# Patient Record
Sex: Female | Born: 1945 | Race: White | Hispanic: No | Smoking: Never smoker
Health system: Southern US, Community
[De-identification: ages and names within clinical notes are randomized; demographics above are authoritative.]

## PROBLEM LIST (undated history)

## (undated) DIAGNOSIS — R519 Headache, unspecified: Secondary | ICD-10-CM

## (undated) DIAGNOSIS — G2581 Restless legs syndrome: Secondary | ICD-10-CM

## (undated) DIAGNOSIS — R42 Dizziness and giddiness: Secondary | ICD-10-CM

## (undated) DIAGNOSIS — R200 Anesthesia of skin: Secondary | ICD-10-CM

## (undated) HISTORY — PX: BACK SURGERY: SHX140

## (undated) HISTORY — DX: Restless legs syndrome: G25.81

## (undated) HISTORY — DX: Dizziness and giddiness: R42

## (undated) HISTORY — DX: Anesthesia of skin: R20.0

## (undated) HISTORY — DX: Headache, unspecified: R51.9

---

## 2021-04-10 ENCOUNTER — Other Ambulatory Visit: Payer: Self-pay | Admitting: Neurological Surgery

## 2021-04-10 DIAGNOSIS — M4696 Unspecified inflammatory spondylopathy, lumbar region: Secondary | ICD-10-CM

## 2021-04-10 DIAGNOSIS — G959 Disease of spinal cord, unspecified: Secondary | ICD-10-CM

## 2021-04-25 ENCOUNTER — Ambulatory Visit
Admission: RE | Admit: 2021-04-25 | Discharge: 2021-04-25 | Disposition: A | Discharge status: Home / Self Care | Payer: Medicare Other | Source: Ambulatory Visit | Attending: Neurological Surgery | Admitting: Neurological Surgery

## 2021-04-25 DIAGNOSIS — G959 Disease of spinal cord, unspecified: Secondary | ICD-10-CM

## 2021-04-25 DIAGNOSIS — M4696 Unspecified inflammatory spondylopathy, lumbar region: Secondary | ICD-10-CM

## 2021-04-25 IMAGING — MR MR LUMBAR SPINE WO/W CM
4 of 7 series · 23 of 48 positions shown · IV contrast (multihance)
Comparison: Lumbar spine radiographs [DATE].

CLINICAL DATA: Discectomy. Low back pain extending into the lower
extremities bilaterally.

EXAM:
MRI LUMBAR SPINE WITHOUT AND WITH CONTRAST
TECHNIQUE: Multiplanar and multiecho pulse sequences of the lumbar spine were
obtained without and with intravenous contrast.
CONTRAST:  12mL MULTIHANCE GADOBENATE DIMEGLUMINE 529 MG/ML IV SOLN

[Series 3: T1 · sagittal · 4.0mm · 0.88mm/px · 3 of 15 slices shown (1 of 2)]
[im 1/15]
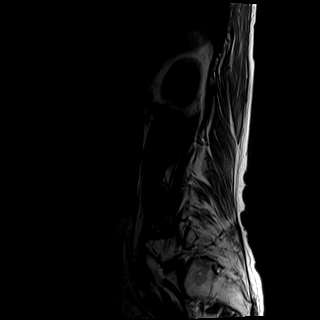
[im 8/15]
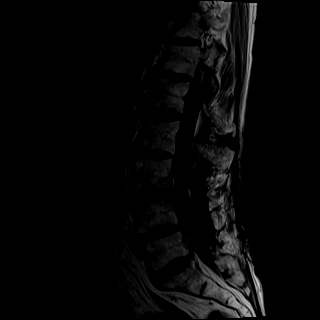
[im 15/15]
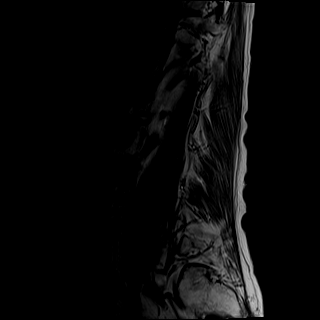

[Series 5: T2 · axial · 4.0mm · 0.39mm/px · z∈[-114,+111]mm · 11 of 44 slices shown (1 of 2)]
[im 1/44]
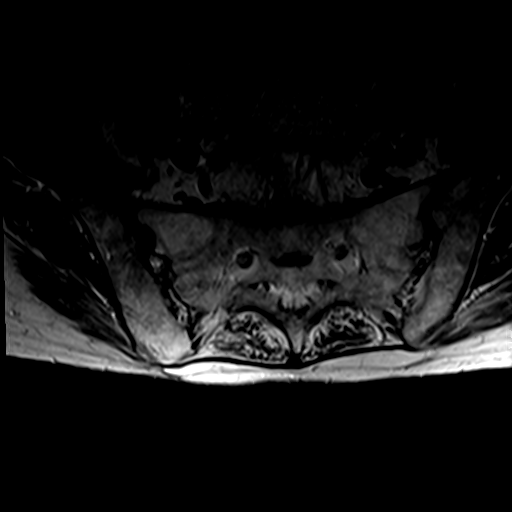
[im 5/44]
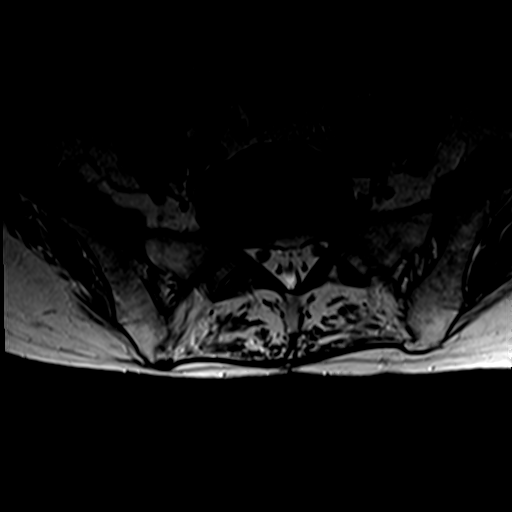
[im 9/44]
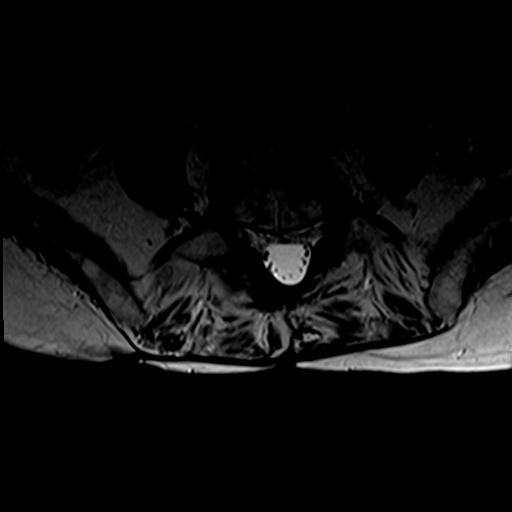
[im 13/44]
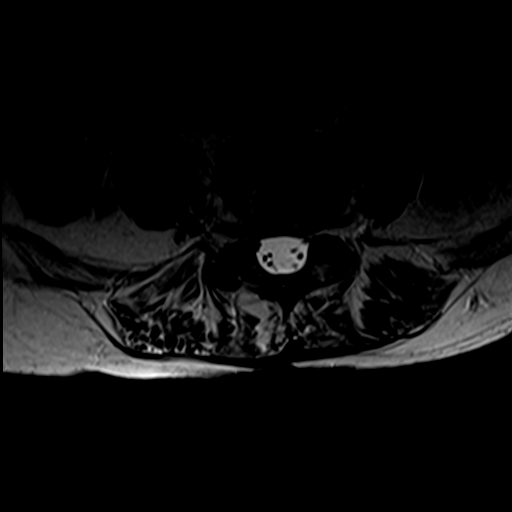
[im 18/44]
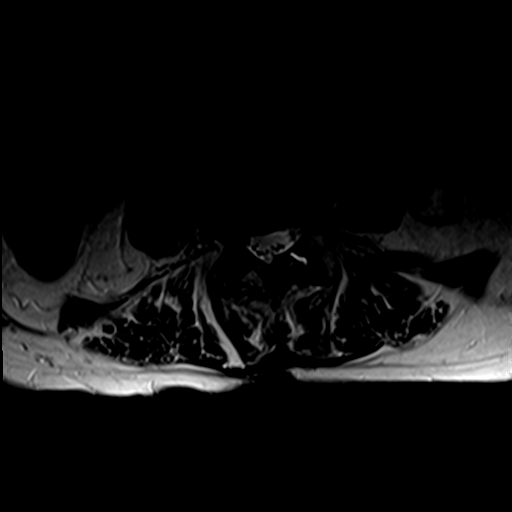
[im 22/44]
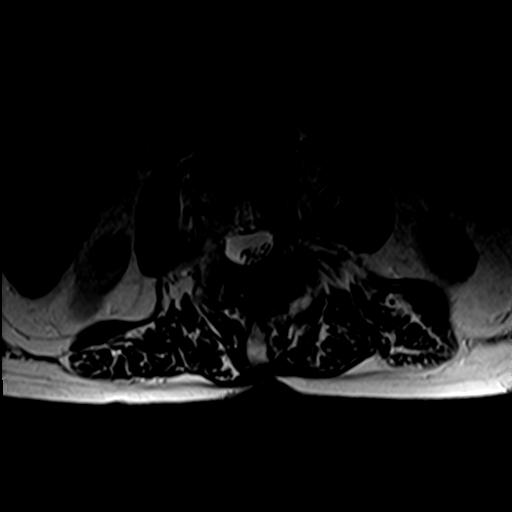
[im 26/44]
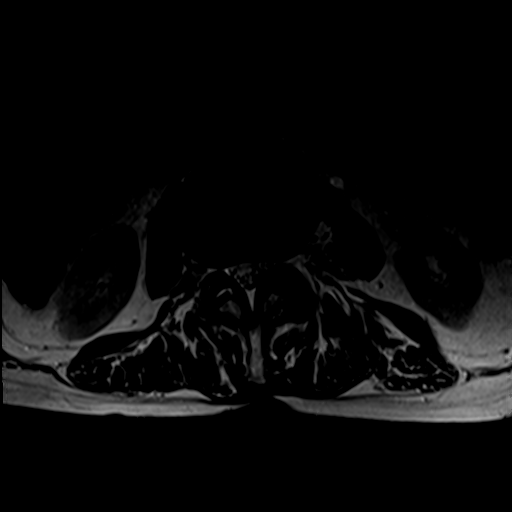
[im 31/44]
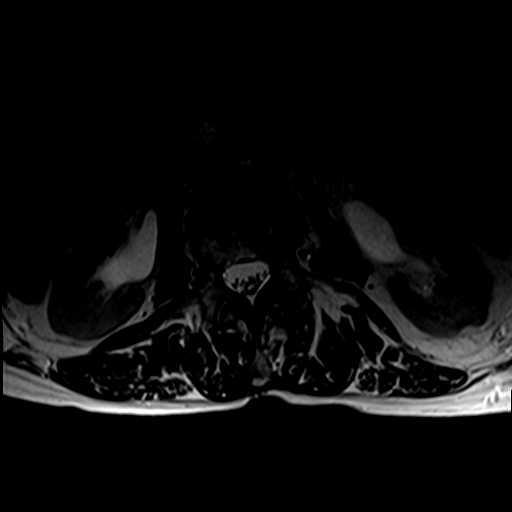
[im 35/44]
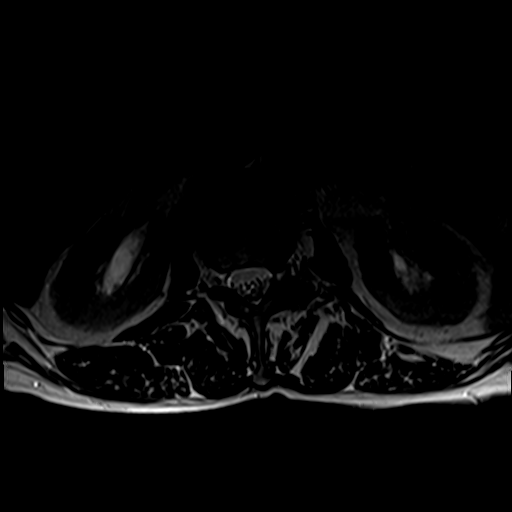
[im 39/44]
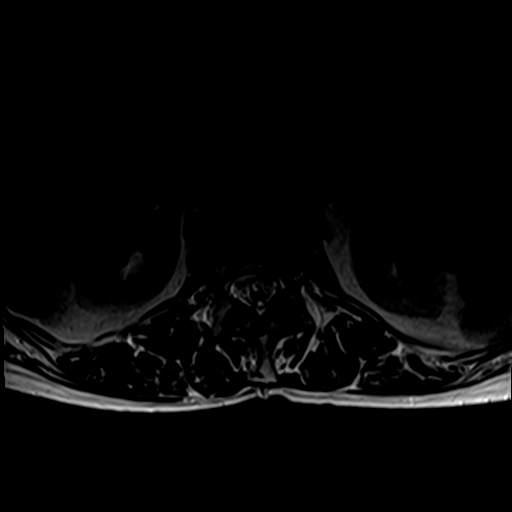
[im 44/44]
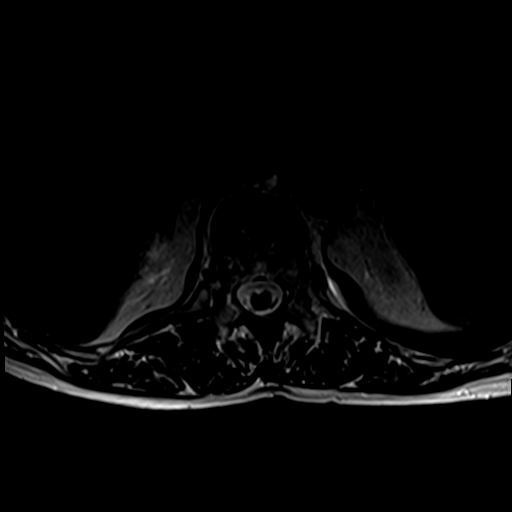

[Series 6: T1 · axial · 4.0mm · 0.39mm/px · z∈[-114,+87]mm · 5 of 44 slices shown (2 of 2)]
[im 1/44]
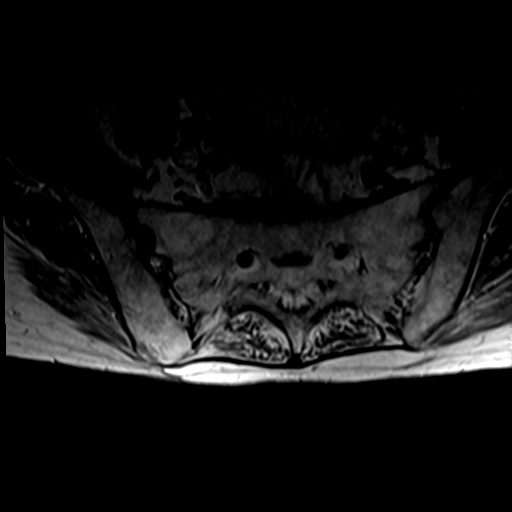
[im 5/44]
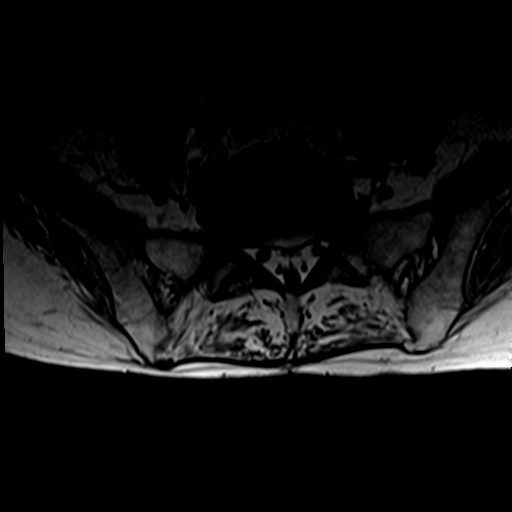
[im 9/44]
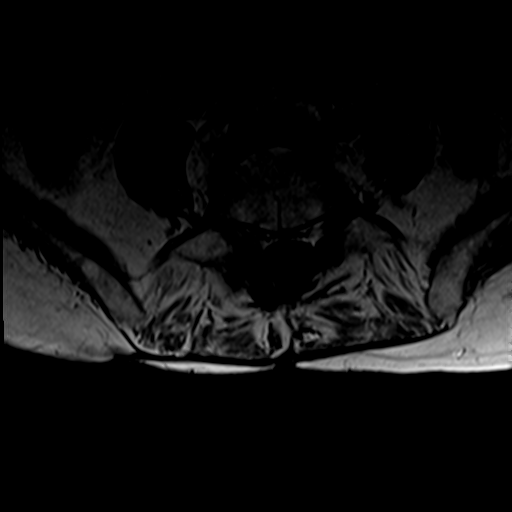
[im 22/44]
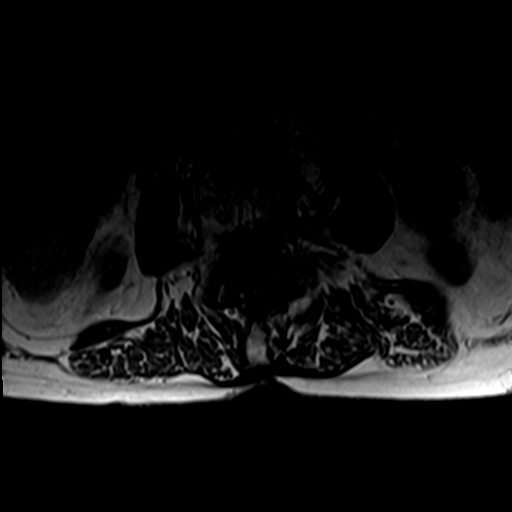
[im 39/44]
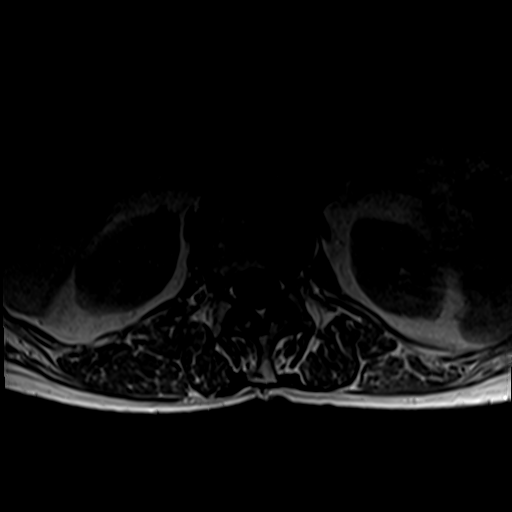

[Series 7: T2 · sagittal · 4.0mm · 1.09mm/px · 4 of 15 slices shown (2 of 2)]
[im 1/15]
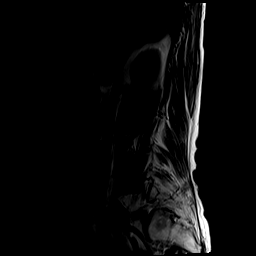
[im 5/15]
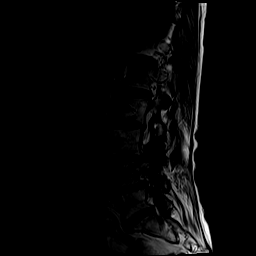
[im 10/15]
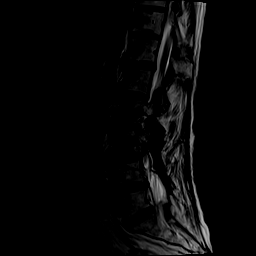
[im 15/15]
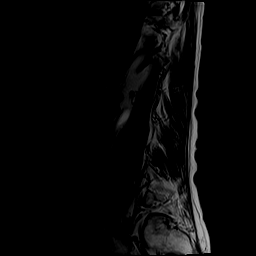

[23 of 48 positions shown; findings below may reference images not displayed]

FINDINGS: Segmentation: 5 non rib-bearing lumbar type vertebral bodies are
present. The lowest fully formed vertebral body is L5.

Alignment: Slight degenerative anterolisthesis is present at L3-4.
No other significant listhesis is present. Rightward curvature is
centered at L2-3.

Vertebrae: Edematous changes are present posteriorly at L3-4. Marrow
signal and vertebral body heights are otherwise normal.

Conus medullaris and cauda equina: Conus extends to the L1 level.
Conus and cauda equina appear normal.

Paraspinal and other soft tissues: Limited imaging the abdomen is
unremarkable. There is no significant adenopathy. No solid organ
lesions are present.

Disc levels:

T12-L1: Mild disc bulging and facet hypertrophy is present without
significant stenosis.

L1-2: Moderate left and mild right facet hypertrophy is present.
Disc bulging noted. Mild left foraminal narrowing is present.

L2-3: A leftward disc protrusion is present. Moderate facet
hypertrophy is worse on the left. Mild left subarticular narrowing
is present. Moderate foraminal narrowing is worse on the left.

L3-4: A broad-based disc protrusion is present. Patient is status
post laminectomy. The central canal is decompressed. Facet
hypertrophy contributes 2 moderate foraminal narrowing, right
greater than left.

L4-5: Broad-based disc protrusion is asymmetric to the right.
Moderate facet hypertrophy is worse on the right. Laminectomy
present. Central canal is decompressed. Moderate foraminal narrowing
is worse on the right.

L5-S1: A shallow central disc protrusion is present. Mild facet
hypertrophy is noted bilaterally. No significant stenosis is
present.
IMPRESSION: 1. Laminectomies at L3-4 and L4-5 with decompression of the central
canal.
2. Moderate foraminal narrowing bilaterally at L3-4 and L4-5 is
worse on the right.
3. Mild left subarticular narrowing at L2-3.
4. Moderate foraminal narrowing at L2-3 is worse on the left.
5. Mild left foraminal narrowing at L1-2.
6. Shallow central disc protrusion and bilateral facet hypertrophy
at L5-S1 without significant stenosis.

## 2021-04-25 IMAGING — MR MR CERVICAL SPINE W/O CM
5 series · 36 of 48 positions shown · non-contrast
Comparison: Cervical spine radiographs [DATE]

CLINICAL DATA: Neck pain extending into the back of the head for 2
years. Bilateral arm tingling.

EXAM:
MRI CERVICAL SPINE WITHOUT CONTRAST
TECHNIQUE: Multiplanar, multisequence MR imaging of the cervical spine was
performed. No intravenous contrast was administered.

[Series 2: T2 · sagittal · 3.0mm · 0.41mm/px · 8 of 17 slices shown (1 of 2)]
[im 1/17]
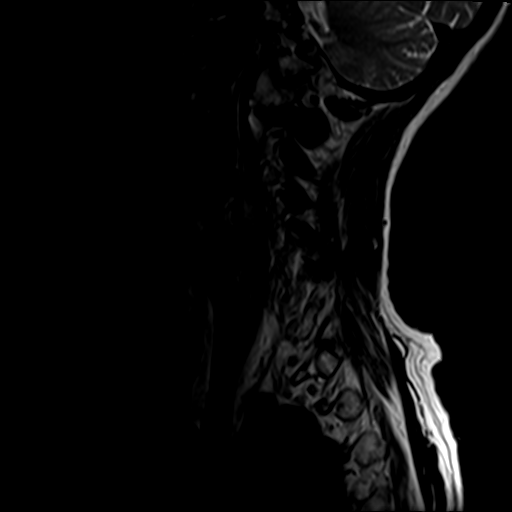
[im 3/17]
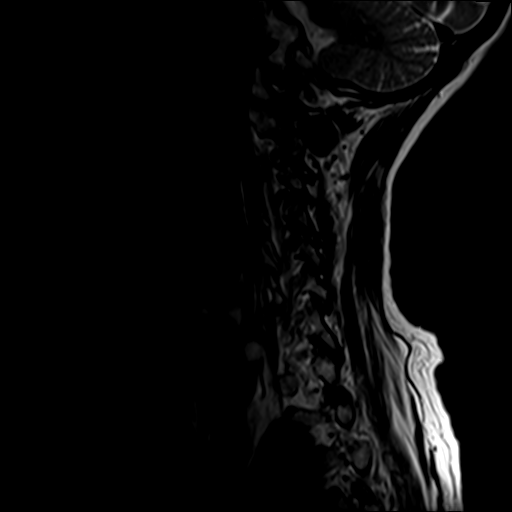
[im 5/17]
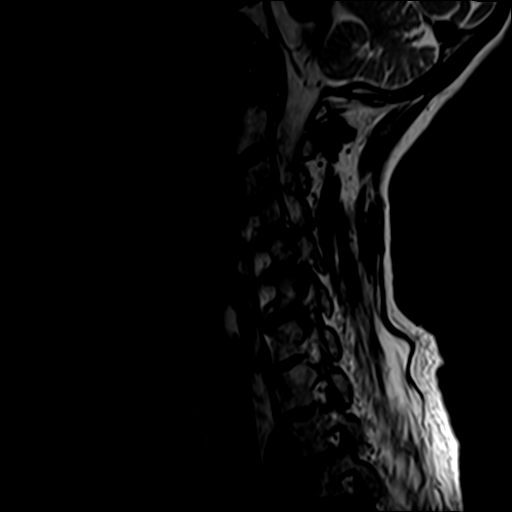
[im 7/17]
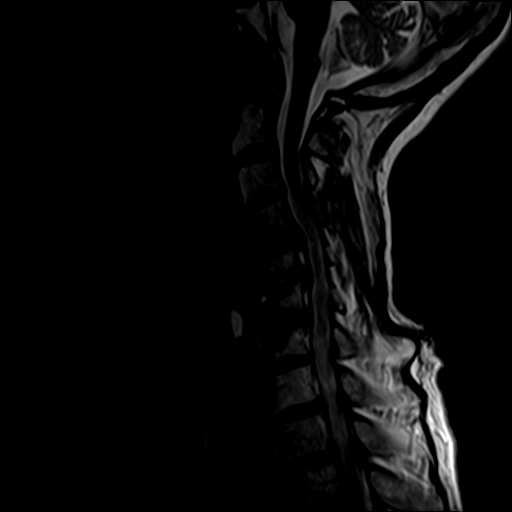
[im 10/17]
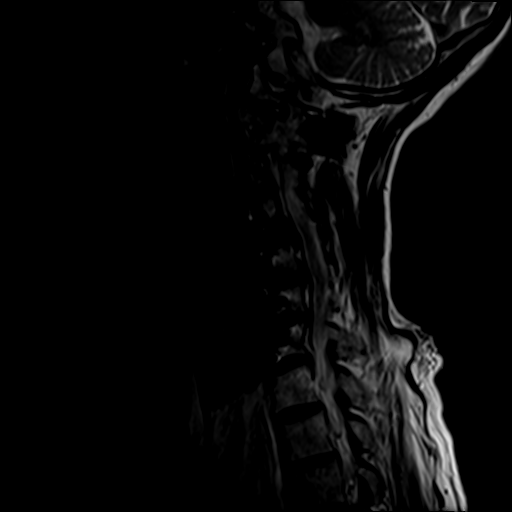
[im 12/17]
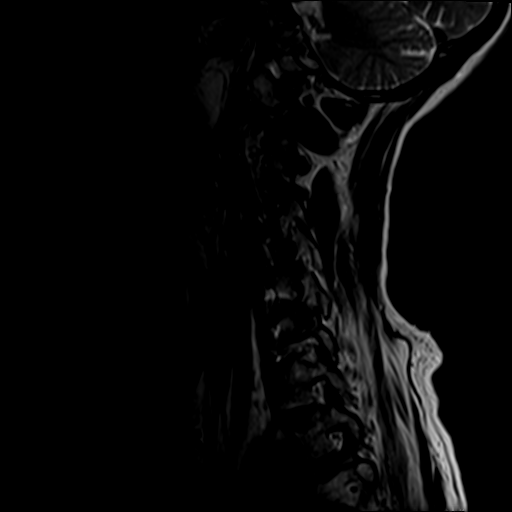
[im 14/17]
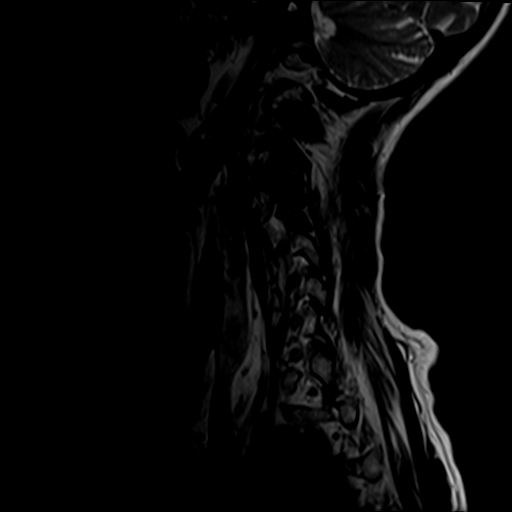
[im 17/17]
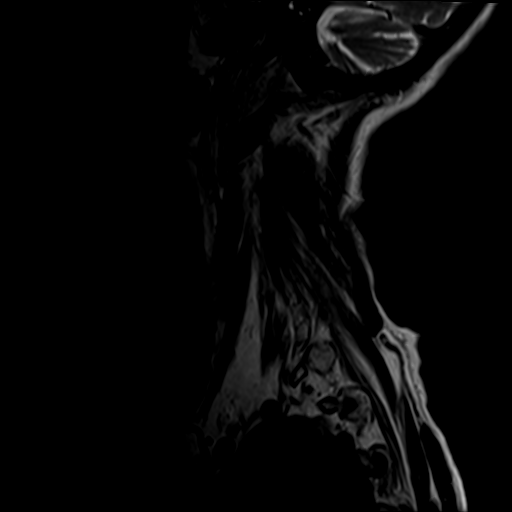

[Series 3: STIR · sagittal · 3.0mm · 0.82mm/px · 8 of 17 slices shown]
[im 1/17]
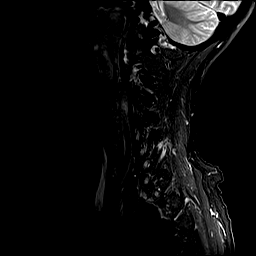
[im 3/17]
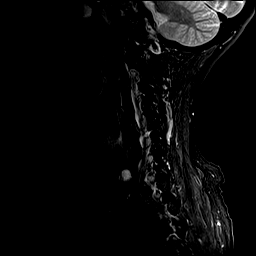
[im 5/17]
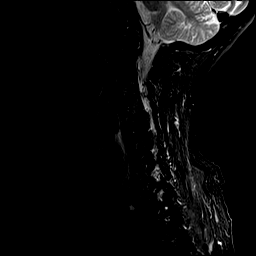
[im 7/17]
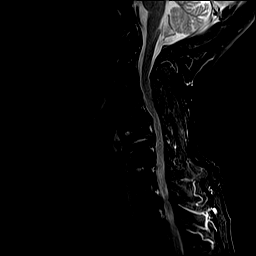
[im 10/17]
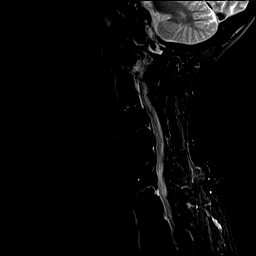
[im 12/17]
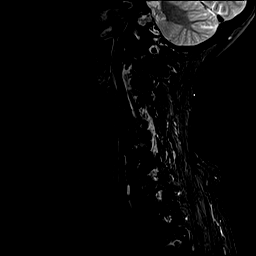
[im 14/17]
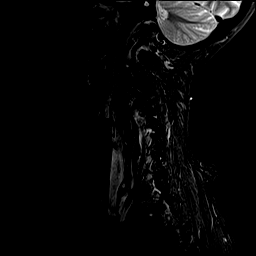
[im 17/17]
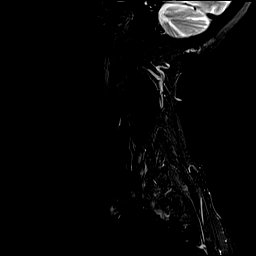

[Series 4: T1 · sagittal · 3.0mm · 0.82mm/px · 8 of 17 slices shown]
[im 1/17]
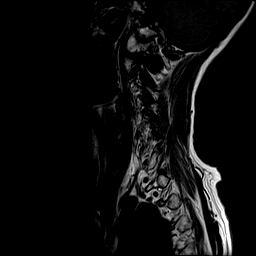
[im 3/17]
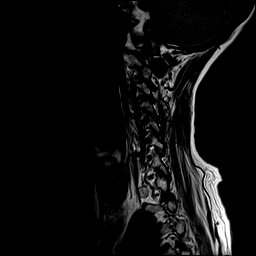
[im 5/17]
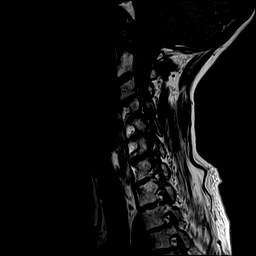
[im 7/17]
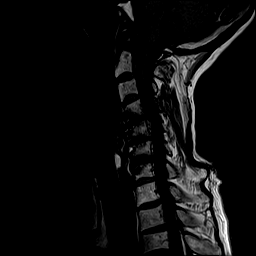
[im 10/17]
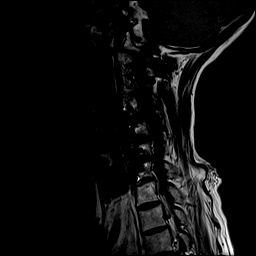
[im 12/17]
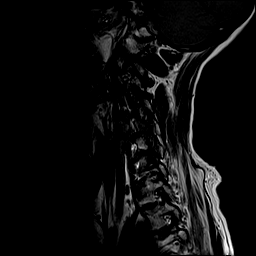
[im 14/17]
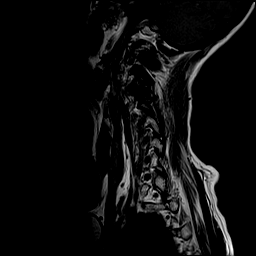
[im 17/17]
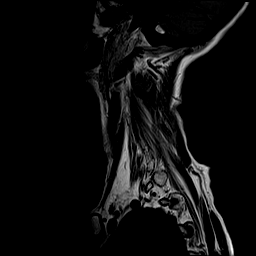

[Series 5: T2 · axial · 3.0mm · 0.70mm/px · z∈[-63,+28]mm · 9 of 26 slices shown (2 of 2)]
[im 1/26]
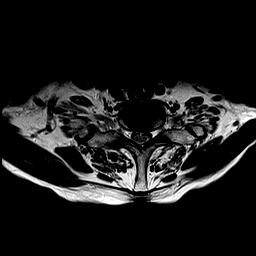
[im 5/26]
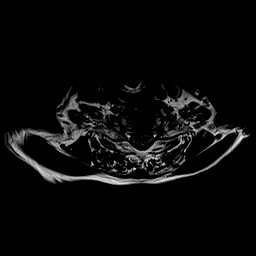
[im 7/26]
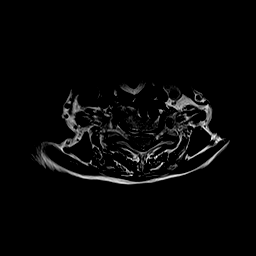
[im 12/26]
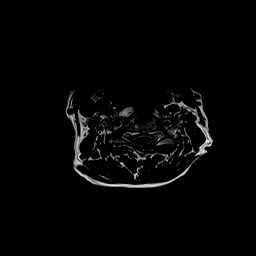
[im 14/26]
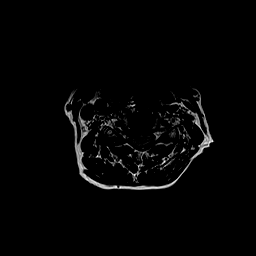
[im 19/26]
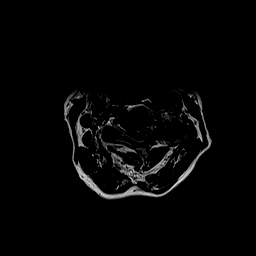
[im 21/26]
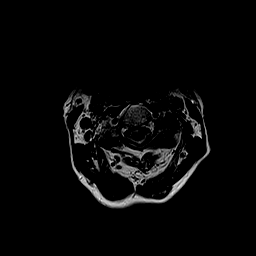
[im 23/26]
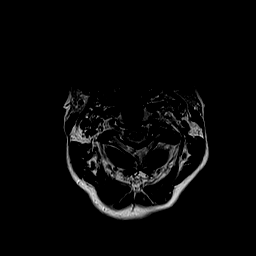
[im 26/26]
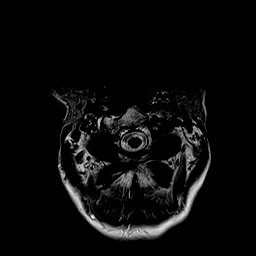

[Series 6: GRE · axial · 3.0mm · 0.35mm/px · z∈[-63,-41]mm · 3 of 26 slices shown]
[im 1/26]
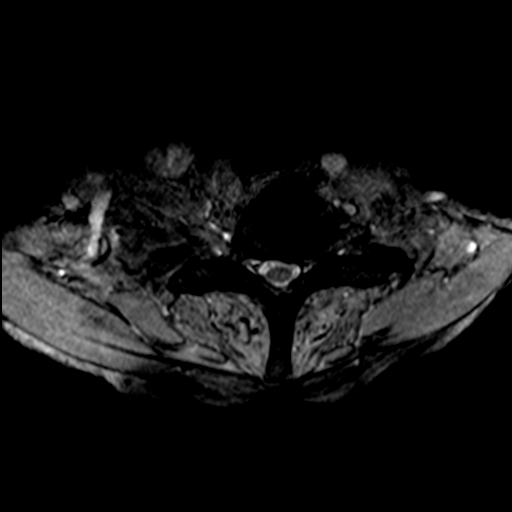
[im 5/26]
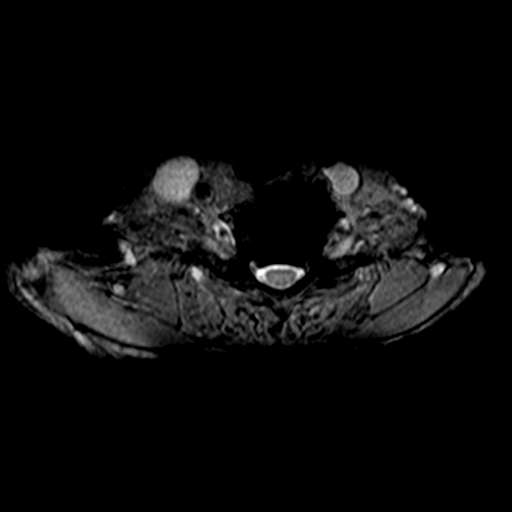
[im 7/26]
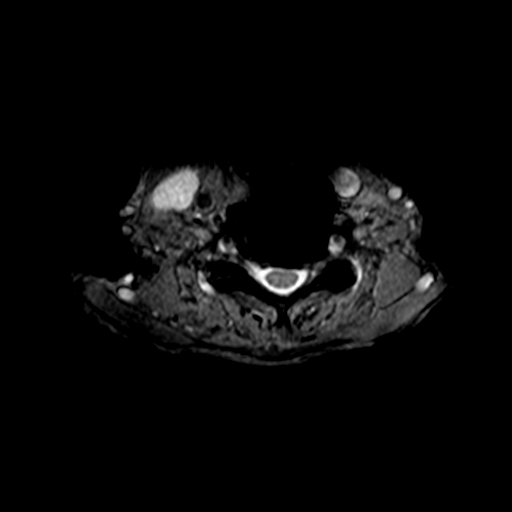

[36 of 48 positions shown; findings below may reference images not displayed]

FINDINGS: Alignment: Slight scratched at no significant listhesis is present.
Straightening of the normal cervical lordosis is noted.

Vertebrae: Susceptibility artifact from anterior fusion hardware at
C4, C5, C6 and C7. Marrow signal and vertebral body heights are
otherwise normal.

Cord: Normal signal and morphology.

Posterior Fossa, vertebral arteries, paraspinal tissues:
Craniocervical junction is normal. Flow is present in the vertebral
arteries bilaterally. Visualized intracranial contents are normal.

Disc levels:

C2-3: Asymmetric left-sided facet spurring results in mild left
foraminal narrowing. Central canal is patent.

C3-4: Asymmetric left-sided facet hypertrophy is present. Mild left
foraminal narrowing is present. Central disc protrusion results in
partial effacement of ventral CSF.

C4-5: Solid anterior fusion is present. Uncovertebral and facet
disease leads to moderate right and mild left foraminal narrowing.

C5-6: Anterior fusion noted. Uncovertebral spurring leads to
moderate foraminal narrowing bilaterally.

C6-7: Anterior fusion noted. Uncovertebral spurring leads to mild
foraminal narrowing bilaterally, left greater than right.

C7-T1: Negative.
IMPRESSION: 1. Anterior fusion at C4-5, C5-6, and C6-7.
2. Mild left foraminal narrowing at C2-3 and C3-4 secondary to
asymmetric left-sided facet hypertrophy.
3. Moderate right and mild left foraminal narrowing at C4-5 due to
residual uncovertebral spurring.
4. Moderate foraminal narrowing bilaterally at C5-6 due to residual
uncovertebral spurring.
5. Mild foraminal narrowing bilaterally at C6-7 is left greater than
right due to residual uncovertebral spurring.

## 2021-04-25 MED ORDER — GADOBENATE DIMEGLUMINE 529 MG/ML IV SOLN
12.0000 mL | Freq: Once | INTRAVENOUS | Status: AC | PRN
  Administered 2021-04-25: 12 mL via INTRAVENOUS

## 2021-06-04 ENCOUNTER — Other Ambulatory Visit: Payer: Self-pay | Admitting: Neurological Surgery

## 2021-06-04 DIAGNOSIS — G119 Hereditary ataxia, unspecified: Secondary | ICD-10-CM

## 2021-06-26 ENCOUNTER — Ambulatory Visit
Admission: RE | Admit: 2021-06-26 | Discharge: 2021-06-26 | Disposition: A | Discharge status: Home / Self Care | Payer: Medicare Other | Source: Ambulatory Visit | Attending: Neurological Surgery | Admitting: Neurological Surgery

## 2021-06-26 DIAGNOSIS — G119 Hereditary ataxia, unspecified: Secondary | ICD-10-CM

## 2021-06-26 IMAGING — MR MR HEAD WO/W CM
12 series · 48 of 48 positions shown · IV contrast (11 ML MULTIHANCE)
Comparison: Cervical and lumbar MRI [DATE].

CLINICAL DATA: 75-year-old female with ataxia for 2.5 years.
Difficulty walking.

EXAM:
MRI HEAD WITHOUT AND WITH CONTRAST
TECHNIQUE: Multiplanar, multiecho pulse sequences of the brain and surrounding
structures were obtained without and with intravenous contrast.
CONTRAST:  11mL MULTIHANCE GADOBENATE DIMEGLUMINE 529 MG/ML IV SOLN

[Series 2: T1 · sagittal · 5.0mm · 0.45mm/px · 1 of 21 slices shown]
[im 1/21]
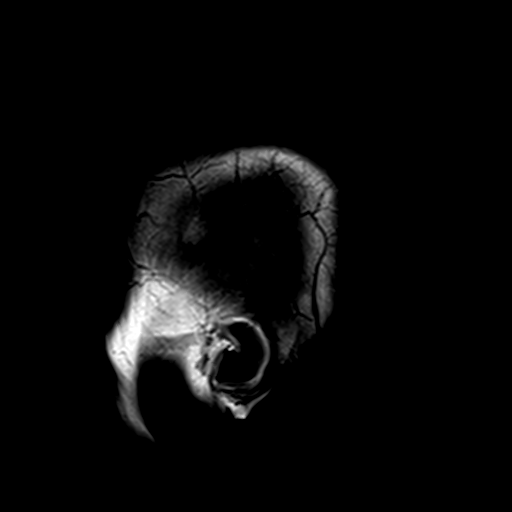

[Series 3: DWI · axial · 3.0mm · 1.80mm/px · z∈[-65,+81]mm · 7 of 99 slices shown (1 of 4)]
[im 1/99]
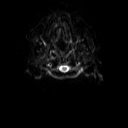
[im 17/99]
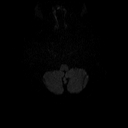
[im 33/99]
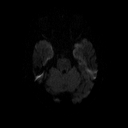
[im 50/99]
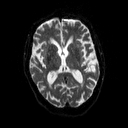
[im 66/99]
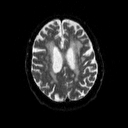
[im 82/99]
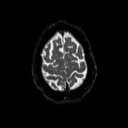
[im 99/99]
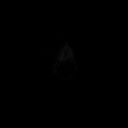

[Series 4: DWI · axial · 3.0mm · 1.80mm/px · z∈[-65,+81]mm · 3 of 50 slices shown (2 of 4)]
[im 1/50]
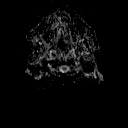
[im 25/50]
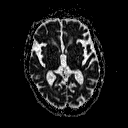
[im 50/50]
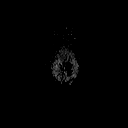

[Series 5: DWI · coronal · 5.0mm · 1.80mm/px · 5 of 67 slices shown (3 of 4)]
[im 1/67]
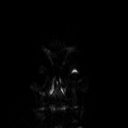
[im 17/67]
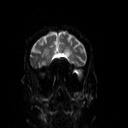
[im 34/67]
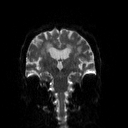
[im 50/67]
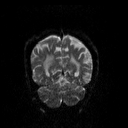
[im 67/67]
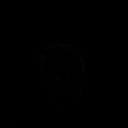

[Series 6: DWI · coronal · 5.0mm · 1.80mm/px · 2 of 34 slices shown (4 of 4)]
[im 1/34]
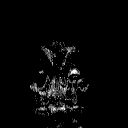
[im 34/34]
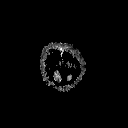

[Series 7: T2 · axial · 5.0mm · 0.60mm/px · z∈[-61,+80]mm · 2 of 22 slices shown (1 of 2)]
[im 1/22]
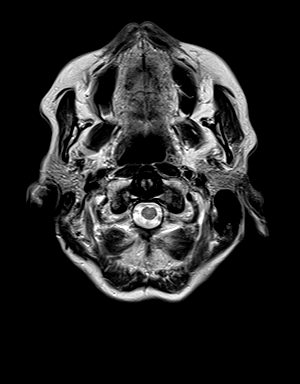
[im 22/22]
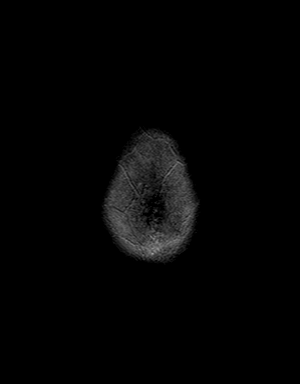

[Series 8: FLAIR · axial · 3.0mm · 0.45mm/px · z∈[-60,+74]mm · 2 of 30 slices shown]
[im 1/30]
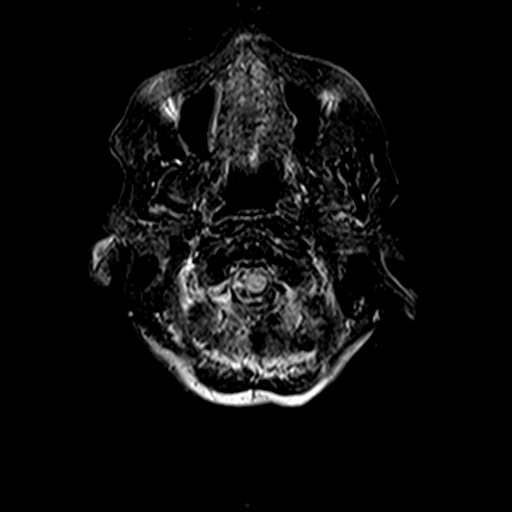
[im 30/30]
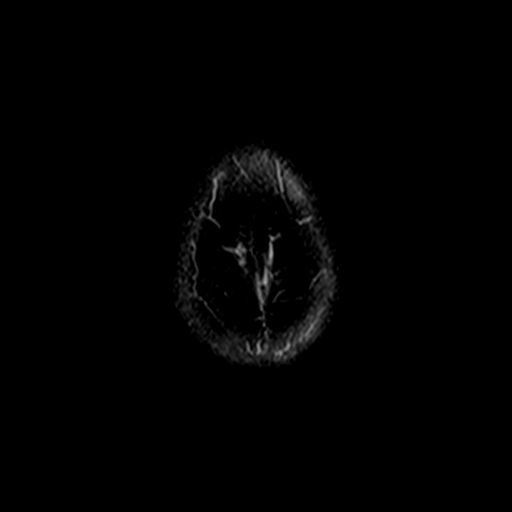

[Series 10: swi_images · axial · 4.0mm · 0.90mm/px · z∈[-62,+77]mm · 2 of 36 slices shown]
[im 1/36]
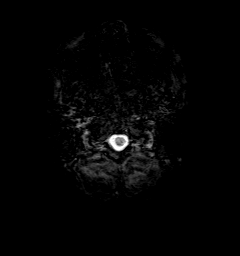
[im 36/36]
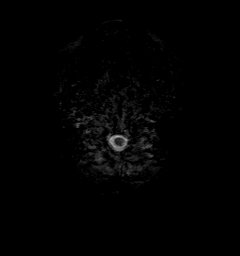

[Series 11: t1_mpr_tra · axial · 1.0mm · 0.75mm/px · z∈[-62,+80]mm · 10 of 144 slices shown]
[im 1/144]
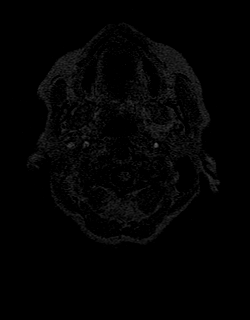
[im 16/144]
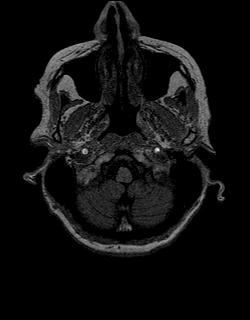
[im 32/144]
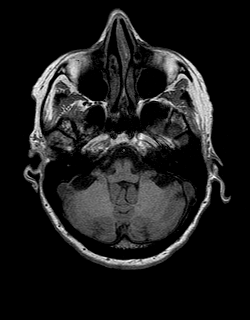
[im 48/144]
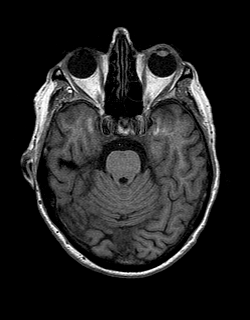
[im 64/144]
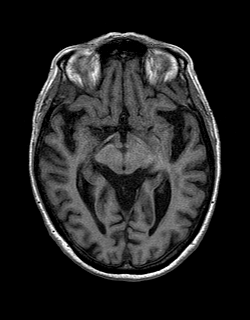
[im 80/144]
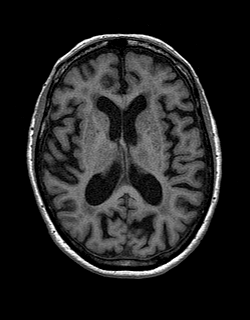
[im 96/144]
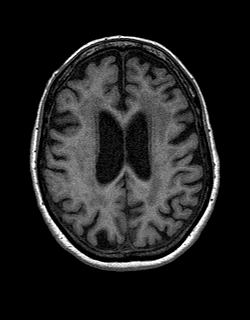
[im 112/144]
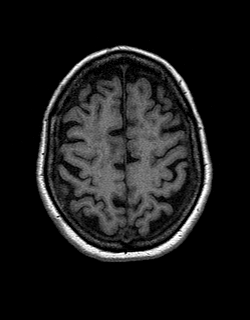
[im 128/144]
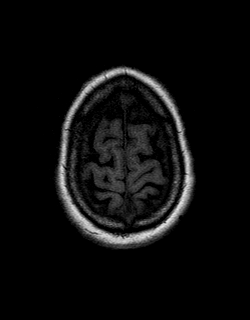
[im 144/144]
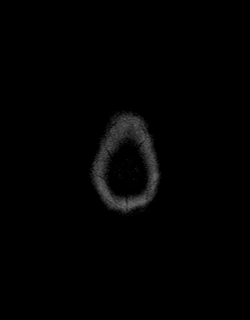

[Series 12: T2 · coronal · 5.0mm · 0.45mm/px · 2 of 25 slices shown (2 of 2)]
[im 1/25]
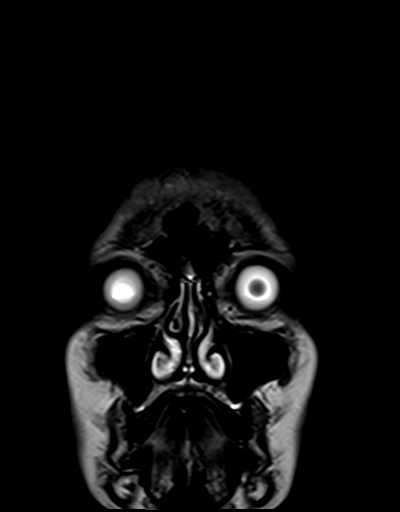
[im 25/25]
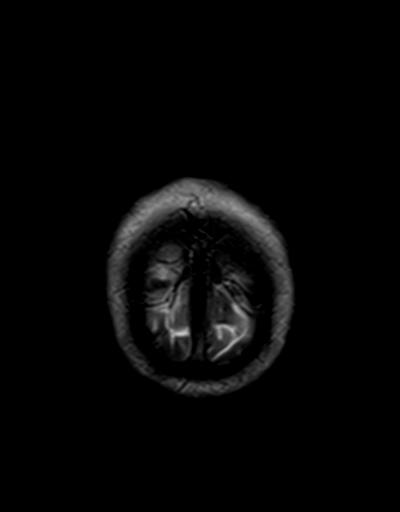

[Series 13: t1_mpr_tra post · axial · 1.0mm · 0.75mm/px · z∈[-62,+80]mm · 10 of 144 slices shown]
[im 1/144]
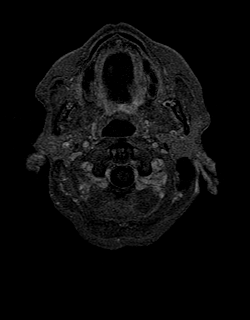
[im 16/144]
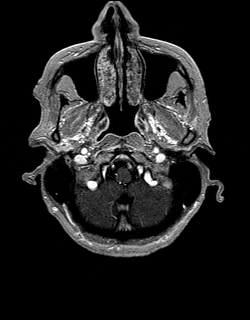
[im 32/144]
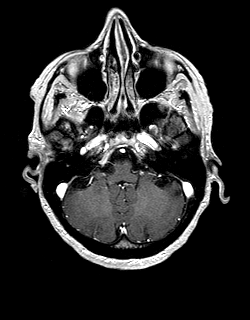
[im 48/144]
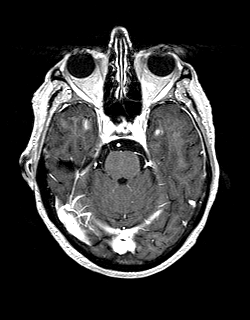
[im 64/144]
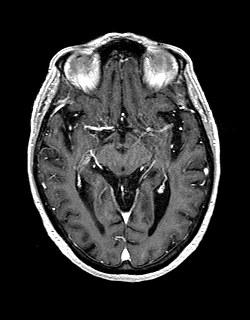
[im 80/144]
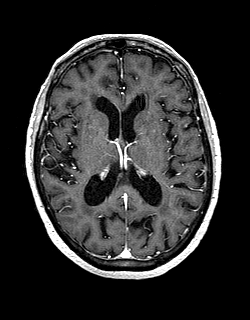
[im 96/144]
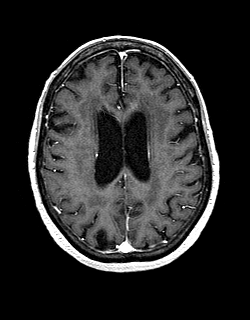
[im 112/144]
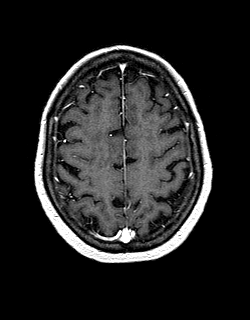
[im 128/144]
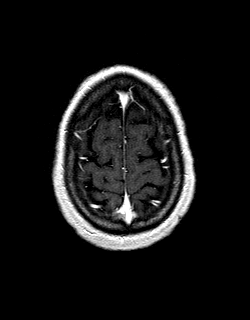
[im 144/144]
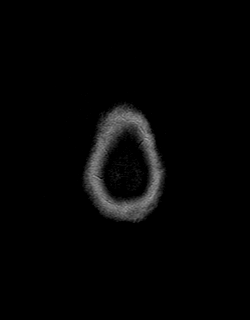

[Series 14: post cor · coronal · 5.0mm · 0.45mm/px · 2 of 25 slices shown]
[im 1/25]
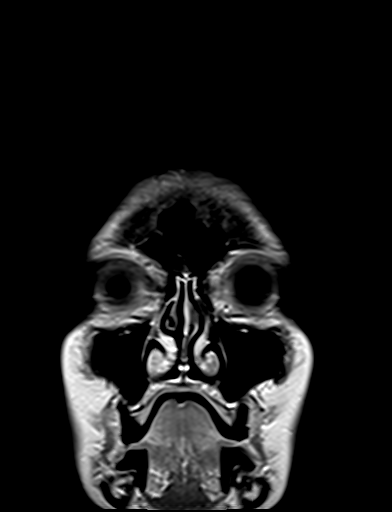
[im 25/25]
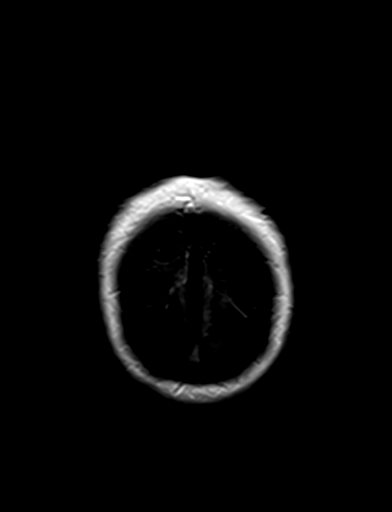

[48 of 48 positions shown; findings below may reference images not displayed]

FINDINGS: Brain: Cerebral volume loss seems generalized. No disproportionate
regions of brain atrophy identified.

No restricted diffusion to suggest acute infarction. No midline
shift, mass effect, evidence of mass lesion, ventriculomegaly,
extra-axial collection or acute intracranial hemorrhage.
Cervicomedullary junction and pituitary are within normal limits.

Confluent bilateral cerebral white matter T2 and FLAIR
hyperintensity (series 8, image 20). No cortical encephalomalacia or
chronic cerebral blood products identified. Deep gray matter nuclei,
brainstem, and cerebellar signal is normal for age.

No abnormal enhancement identified.  No dural thickening.

Vascular: Major intracranial vascular flow voids are preserved.
Dominant distal left vertebral artery. The major dural venous
sinuses are enhancing and appear to be patent.

Skull and upper cervical spine: Partially visible cervical spine
hardware artifact at C4. Negative visible upper cervical spine.
Visualized bone marrow signal is within normal limits.

Sinuses/Orbits: Postoperative changes to the right globe. Otherwise
negative orbits. Paranasal sinuses and mastoids are clear.

Other: Visible internal auditory structures appear normal. Negative
visible scalp and face.
IMPRESSION: 1. No acute intracranial abnormality.
2. Moderately advanced but nonspecific white matter signal changes,
most commonly due to chronic small vessel disease.
3. Generalized cerebral volume loss, with no disproportionate
regions of brain atrophy identified.

Cerebral Atrophy ([ME]-[ME]).

## 2021-06-26 MED ORDER — GADOBENATE DIMEGLUMINE 529 MG/ML IV SOLN
11.0000 mL | Freq: Once | INTRAVENOUS | Status: AC | PRN
  Administered 2021-06-26: 11 mL via INTRAVENOUS

## 2021-09-28 ENCOUNTER — Ambulatory Visit: Payer: Medicare PPO | Admitting: Neurology

## 2021-09-28 ENCOUNTER — Encounter: Payer: Self-pay | Admitting: Neurology

## 2021-09-28 VITALS — BP 147/76 | HR 65 | Ht 70.0 in | Wt 121.6 lb

## 2021-09-28 DIAGNOSIS — I679 Cerebrovascular disease, unspecified: Secondary | ICD-10-CM | POA: Diagnosis not present

## 2021-09-28 DIAGNOSIS — I999 Unspecified disorder of circulatory system: Secondary | ICD-10-CM | POA: Diagnosis not present

## 2021-09-28 DIAGNOSIS — G214 Vascular parkinsonism: Secondary | ICD-10-CM | POA: Diagnosis not present

## 2021-09-28 DIAGNOSIS — R9082 White matter disease, unspecified: Secondary | ICD-10-CM | POA: Diagnosis not present

## 2021-09-28 NOTE — Progress Notes (Signed)
[facility]    Provider:  Dr Lucia Gaskins Requesting Provider: Barnett Abu, MD Primary Care Provider:  Rolan Bucco., PA-C  CC:  imbalance, multiple neck and low back surgeries  HPI:  Kelly Choi is a 76 y.o. female here as requested by Barnett Abu, MD for cerebellar ataxia.  But patient and son states she is here for headache and numbness and tingling in both feet and toes and imbalance issues.   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  Son is here and provides much information. She had her surgeries in [place] and I don't have her prior images. EMG/NCS have been normal per report by patient.  Since she has surgery on the low back and in the neck. After her operations she feels balance, she has to hold onto everything, her legs are so weak she can't get In her bathtub. She tried physical therapy. She also broke hip from a fall. No other falls. She has to hold onto things. She feels her legs are weak and she can;t feel her feet when she sets them on the floor. The feet are painful and numb and tingly, started happening after [facility] hospital after she broke her hip and she tried therapy in [place]. She denies diabetes, no high cholesterol, no high blood pressure. Never been diagnosed with B12 deficiency like to take vitamins, both cervical and lower spine surgeries were 2.5 years and right when she noticed the imbalance, stable, no falls, not progressive, she is very careful. She can't drive. After hip injury in the hospital work up with the numb feet, symmetrically, she cannot squat down, knees hurt as well, already been to 2 other neurologists and had emg/ncs. She has extensive workup with blood  work.  Vice volume has stayed the same, handwriting same, no acting out of dreams.  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 Refuses to use walker. 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  Reviewed notes, labs and imaging from outside physicians, which showed:  MRI brain: IMPRESSION: November 2022 reviewed MRI brain images personally reviewed images of the brain(additional 10 minutes prior to appointment) and reviewed with patient and son as well 1. No acute intracranial abnormality. 2. Moderately advanced but nonspecific white matter signal changes, most commonly due to chronic small vessel disease. 3. Generalized cerebral volume loss, with no disproportionate regions of brain atrophy identified.  MRI lumbar spine: 04/2021  1. Laminectomies at L3-4 and L4-5 with decompression of the central canal. 2. Moderate foraminal narrowing bilaterally at L3-4 and L4-5 is worse on the right. 3. Mild left subarticular narrowing at L2-3. 4. Moderate foraminal narrowing at L2-3 is worse on the left. 5. Mild left foraminal narrowing at L1-2. 6. Shallow central disc protrusion and bilateral facet hypertrophy at L5-S1 without significant stenosis.  Follow-up in 2019 was greater than 1525, RA was negative, CK was 154 normal, folate was normal  MRI cervical spine 04/2021: 1. Anterior fusion at C4-5, C5-6, and C6-7. 2. Mild left foraminal narrowing at C2-3 and C3-4  secondary to asymmetric left-sided facet hypertrophy. 3. Moderate right and mild left foraminal narrowing at C4-5 due to residual uncovertebral spurring. 4. Moderate foraminal narrowing bilaterally at C5-6 due to residual uncovertebral spurring. 5. Mild foraminal narrowing bilaterally at C6-7 is left greater  than right due to residual uncovertebral spurring. Review of Systems: Patient complains of symptoms per HPI as well as the following symptoms imbalance, falls. Pertinent negatives and positives per HPI. All others negative.   Social History   Socioeconomic History   Marital status: Widowed    Spouse name: Not on file   Number of children: Not on file   Years of education: Not on file   Highest education level: Not on file  Occupational History   Not on file  Tobacco Use   Smoking status: Never   Smokeless tobacco: Never  Vaping Use   Vaping Use: Never used  Substance and Sexual Activity   Alcohol use: Not Currently   Drug use: Never   Sexual activity: Not on file  Other Topics Concern   Not on file  Social History Narrative   Not on file   Social Determinants of Health   Financial Resource Strain: Not on file  Food Insecurity: Not on file  Transportation Needs: Not on file  Physical Activity: Not on file  Stress: Not on file  Social Connections: Not on file  Intimate Partner Violence: Not on file    Family History  Problem Relation Age of Onset   Neuropathy Neg Hx    Migraines Neg Hx     Past Medical History:  Diagnosis Date   Dizziness    Headache    Numbness    RLS (restless legs syndrome)     Patient Active Problem List   Diagnosis Date Noted   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     ALPRAZolam (XANAX) 1 MG tablet Take by mouth.     CALCIUM PO Take 600 mg by mouth daily.     Cholecalciferol (VITAMIN D3 PO) Take by mouth daily.     Cyanocobalamin (VITAMIN B 12 PO) Take 1 tablet by mouth daily.     No current  facility-administered medications for this visit.    Allergies as of 09/28/2021   (Not on File)    Vitals: BP (!) 147/76    Pulse 65    Ht 5\' 10"  (1.778 m)    Wt 121 lb 9.6 oz (55.2 kg)    BMI 17.45 kg/m  Last Weight:  Wt Readings from Last 1 Encounters:  09/28/21 121 lb 9.6 oz (55.2 kg)   Last Height:   Ht Readings from Last 1 Encounters:  09/28/21 5\' 10"  (1.778 m)     Physical exam: Exam: Gen: NAD, conversant, well nourised, thin, well groomed                     CV: RRR, no MRG. No Carotid Bruits. No peripheral edema, warm, nontender Eyes: Conjunctivae clear without exudates or hemorrhage  Neuro: Detailed Neurologic Exam  Speech:    Speech is normal; fluent and spontaneous with normal comprehension.  Cognition:    The patient is  oriented to person, place, and time;     recent and remote memory intact;     language fluent;     normal attention, concentration,     fund of knowledge Cranial Nerves: mild hypomimia    The pupils are equal, round, and reactive to light. Pupils too small to visualize fundi. Visual fields are full to finger confrontation. Extraocular movements are intact. Trigeminal sensation is intact and the muscles of mastication are normal. The face is symmetric. The palate elevates in the midline. Hearing intact. Voice is normal. Shoulder shrug is normal. The tongue has normal motion without fasciculations.   Coordination:    Normal finger to nose  Gait:   Short, narrow steps, decreased arm swing, enblock turning, narrow gait, very imbalanced (not using a walking aid), low clearance.   Motor Observation:    No asymmetry, no atrophy, and no involuntary movements noted. Tone:    Normal muscle tone.    Posture:    Posture is normal. normal erect    Strength: left prox hip weakness (due to rod s/p hip fracture) otherwise strength is V/V in the upper and lower limbs.      Sensation: has some feeling to pin prick distally but largelydecreased to  above the knees, absent temp to above knees, a few seconds medial malleoi vibration      Reflex Exam:  DTR's:    Abent AJs otherwsie Deep tendon reflexes in the upper and lower extremities are brisk bilaterally.   Toes:    Right down, left equiv  Clonus:    Clonus is absent.    Assessment/Plan:  76 y.o. female here as requested by Barnett Abu, MD for ataxia.  But patient and son states she is here for numbness and tingling in both feet and toes and imbalance issues.   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  Son is here and provides much information. She had her surgeries in [place] and I don't have her prior images. EMG/NCS have been normal per report by patient who states she has had 2 neurology workups in the past.   -Patient has severe chronic microvascular ischemic changes(Vascular parkinsonism not parkinson's disease), confluent, this is likely the majority cause of her gait abnormalities and imbalance although may be multifactorial including arthritic changes, cervical and lumbar surgeries, hip/pelvis fractures, significant peripheral neuropathy with decreased sensation to the knees.  She is parkinsonian on exam likely vascular parkinsonism is the major contributor (not Parkinson's disease).  Son states that she has a remote history of heavy alcohol use and lots of exposure to cigarette smoke which can both contribute to severe blood vessel disease in the brain.  -Since she was referred by Dr. Danielle Dess, I do not have any primary care notes, I do not have any of her prior two neurology work-ups, or her prior images from her neurosurgeons to see how significant the cervical/lumbar stenoses were.  I will request these  however again I do think that the most contributory etiology of her imbalance and parkinsonism is extensive chronic confluent white matter changes in the brain consistent with severe chronic microvascular ischemic changes. I discussed with son outside the room. Gave the literature to read on this condition that can cause gait abnormalities, falls, dementia.   -  After review of prior records, will call son    Cc: Barnett AbuElsner, Henry, MD,  Rolan Buccoompton, Kimberly D., PA-C  Naomie DeanAntonia Tatisha Cerino, MD  [facility] [address] [place], [place] [id number]  Phone [phone] Fax [phone]  I spent over 70 minutes of face-to-face and non-face-to-face time with patient on the  1. Vascular parkinsonism (HCC)   2. Cerebrovascular disease   3. 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  This included previsit chart review, lab review, study review, order entry, electronic health record documentation, patient education on the different diagnostic and therapeutic options, counseling and coordination of care, risks and benefits of management, compliance, or risk factor reduction

## 2021-09-29 ENCOUNTER — Telehealth: Payer: Self-pay | Admitting: *Deleted

## 2021-09-29 NOTE — Telephone Encounter (Signed)
error 

## 2021-09-29 NOTE — Telephone Encounter (Signed)
Pt request faxed to Dr Jacquelynn Cree [phone], Adora Fridge [phone], Dr Gae Dry [phone] Lavena Bullion [phone]

## 2021-09-30 ENCOUNTER — Telehealth: Payer: Self-pay | Admitting: Neurology

## 2021-09-30 NOTE — Telephone Encounter (Signed)
Looks like we already received records from a Dr Charlott Holler. Requests were sent yesterday to Dr Marzella Schlein and Dr Janann August.   Stanton Kidney, what about Dr Jerral Ralph?

## 2021-09-30 NOTE — Telephone Encounter (Signed)
Pt called stating that she was to call back with provider names of whom she has seen so that we can request records. Pt gave the names of  Dr. Merilyn Baba Dr. Mariel Kansky Dr. Clovis Pu all whom which practice at [place]. She also states she was seen at the [facility].

## 2021-09-30 NOTE — Telephone Encounter (Signed)
Ok thank you!   Kelly Choi

## 2021-09-30 NOTE — Telephone Encounter (Signed)
R/c note from Dr Adora Fridge notes on Dr Lucia Gaskins desk.

## 2021-10-12 NOTE — Telephone Encounter (Signed)
Pt asking if Dr. Lucia Gaskins has received all the information. Would like a call from the nurse to discuss next step, after reviewing information before scheduling appt with Dr. Barnett Abu.

## 2021-10-14 NOTE — Telephone Encounter (Signed)
I called the patient and let her know, per Dr Lucia Gaskins, that Dr Lucia Gaskins had received all her records but there are over 100 pages and its going to take some time to review, possibly a few weeks. She sees many patients everyday and will get to them as quick as she can but it will take some time.The patient was very appreciative for the call and she verbalized understanding. She will wait for a call from [place] at a later date.

## 2021-10-21 NOTE — Telephone Encounter (Signed)
Pt called wanting to know what the update is on these records. Please advise. ?

## 2021-10-26 NOTE — Telephone Encounter (Signed)
Pt has called for an update, the message from [place], [place] was relayed back to  pt.  Pt states its been since 02-06.  I was pointed out to pt that it will take some time considering it is over 100 pages.  Pt states she will wait.  ?

## 2021-11-04 NOTE — Telephone Encounter (Signed)
Pt checking on update on paperwork sent from [facility] and have you seen what was going on. Would like a call back. ?

## 2021-11-09 NOTE — Telephone Encounter (Signed)
Spoke with pt and offered end of day on March 22nd. She stated the day was available but she wanted earlier so I gave her a couple options and she was scheduled for 1:30 PM arrival 1:15 PM. Pt was very appreciative of the call.  ?

## 2021-11-11 ENCOUNTER — Ambulatory Visit: Payer: Medicare PPO | Admitting: Neurology

## 2021-11-16 ENCOUNTER — Ambulatory Visit: Payer: Medicare PPO | Admitting: Neurology

## 2021-11-16 ENCOUNTER — Encounter: Payer: Self-pay | Admitting: Neurology

## 2021-11-16 VITALS — BP 144/74 | HR 65 | Ht 70.0 in | Wt 119.4 lb

## 2021-11-16 DIAGNOSIS — R270 Ataxia, unspecified: Secondary | ICD-10-CM | POA: Diagnosis not present

## 2021-11-16 DIAGNOSIS — M48061 Spinal stenosis, lumbar region without neurogenic claudication: Secondary | ICD-10-CM

## 2021-11-16 DIAGNOSIS — E531 Pyridoxine deficiency: Secondary | ICD-10-CM

## 2021-11-16 DIAGNOSIS — R200 Anesthesia of skin: Secondary | ICD-10-CM

## 2021-11-16 DIAGNOSIS — E519 Thiamine deficiency, unspecified: Secondary | ICD-10-CM

## 2021-11-16 DIAGNOSIS — R634 Abnormal weight loss: Secondary | ICD-10-CM

## 2021-11-16 DIAGNOSIS — R269 Unspecified abnormalities of gait and mobility: Secondary | ICD-10-CM

## 2021-11-16 DIAGNOSIS — I679 Cerebrovascular disease, unspecified: Secondary | ICD-10-CM

## 2021-11-16 DIAGNOSIS — G959 Disease of spinal cord, unspecified: Secondary | ICD-10-CM

## 2021-11-16 DIAGNOSIS — G6289 Other specified polyneuropathies: Secondary | ICD-10-CM

## 2021-11-16 DIAGNOSIS — E538 Deficiency of other specified B group vitamins: Secondary | ICD-10-CM

## 2021-11-16 NOTE — Patient Instructions (Addendum)
Occipital Neuralgia: Recommend see Dr. Lorrine KinEichman at [facility] for evaluation of injections into the cervical spine -discuss with Dr. Danielle DessElsner.  ?2. Dr. Danielle DessElsner: call and make an appointment ?3. Cold feet/cold to the touch: Long history of smoking, check vascular studies with primary care, discuss directly with pcp ?4. Neuropathy in feet: Hx of B12 deficiency and multiple low back surgeries but will check a few more blood tests to ensure we are not missing anything ?5. Please use walking aid, fall precautions ? ? ? ?Occipital Neuralgia ? ? ?Occipital neuralgia is a type of headache that causes brief episodes of very bad pain in the back of the head. Pain from occipital neuralgia may spread (radiate) to other parts of the head. ?These headaches may be caused by irritation of the nerves that leave the spinal cord high up in the neck, just below the base of the skull (occipital nerves). The occipital nerves transmit sensations from the back of the head, the top of the head, and the areas behind the ears. ?What are the causes? ?This condition can occur without any known cause (primary headache syndrome). In other cases, this condition is caused by pressure on or irritation of one of the two occipital nerves. Pressure and irritation may be due to: ?Muscle spasm in the neck. ?Neck injury. ?Wear and tear of the vertebrae in the neck (osteoarthritis). ?Disease of the disks that separate the vertebrae. ?Swollen blood vessels that put pressure on the occipital nerves. ?Infections. ?Tumors. ?Diabetes. ?What are the signs or symptoms? ?This condition causes brief burning, stabbing, electric, shocking, or shooting pain in the back of the head that can radiate to the top of the head. It can happen on one side or both sides of the head. It can also cause: ?Pain behind the eye. ?Pain triggered by neck movement or hair brushing. ?Scalp tenderness. ?Aching in the back of the head between episodes of very bad pain. ?Pain that  gets worse with exposure to bright lights. ?How is this diagnosed? ?Your health care provider may diagnose the condition based on a physical exam and your symptoms. Tests may be done, such as: ?Imaging studies of the brain and neck (cervical spine), such as an MRI or CT scan. These look for causes of pinched nerves. ?Applying pressure to the nerves in the neck to try to re-create the pain. ?Injection of numbing medicine into the occipital nerve areas to see if pain goes away (diagnostic nerve block). ?How is this treated? ?Treatment for this condition may begin with simple measures, such as: ?Rest. ?Massage. ?Applying heat or cold to the area. ?Over-the-counter pain relievers. ?If these measures do not work, you may need other treatments, including: ?Medicines, such as: ?Prescription-strength anti-inflammatory medicines. ?Muscle relaxants. ?Anti-seizure medicines, which can relieve pain. ?Antidepressants, which can relieve pain. ?Injected medicines, such as medicines that numb the area (local anesthetic) and steroids. ?Pulsed radiofrequency ablation. This is when wires are implanted to deliver electrical impulses that block pain signals from the occipital nerve. ?Surgery to relieve nerve pressure. ?Physical therapy. ?Follow these instructions at home: ?Managing pain ?  ?Avoid any activities that cause pain. ?Rest when you have an attack of pain. ?Try gentle massage to relieve pain. ?Try a different pillow or sleeping position. ?If directed, apply heat to the affected area as often as told by your health care provider. Use the heat source that your health care provider recommends, such as a moist heat pack or a heating pad. ?Place a towel between your skin  and the heat source. ?Leave the heat on for 20-30 minutes. ?Remove the heat if your skin turns bright red. This is especially important if you are unable to feel pain, heat, or cold. You have a greater risk of getting burned. ?If directed, put ice on the back of  your head and neck area. To do this: ?Put ice in a plastic bag. ?Place a towel between your skin and the bag. ?Leave the ice on for 20 minutes, 2-3 times a day. ?Remove the ice if your skin turns bright red. This is very important. If you cannot feel pain, heat, or cold, you have a greater risk of damage to the area. ?General instructions ?Take over-the-counter and prescription medicines only as told by your health care provider. ?Avoid things that make your symptoms worse, such as bright lights. ?Try to stay active. Get regular exercise that does not cause pain. Ask your health care provider to suggest safe exercises for you. ?Work with a physical therapist to learn stretching exercises you can do at home. ?Practice good posture. ?Keep all follow-up visits. This is important. ?Contact a health care provider if: ?Your medicine is not working. ?You have new or worsening symptoms. ?Get help right away if: ?You have very bad head pain that does not go away. ?You have a sudden change in vision, balance, or speech. ?These symptoms may represent a serious problem that is an emergency. Do not wait to see if the symptoms will go away. Get medical help right away. Call your local emergency services (911 in the [place] Do not drive yourself to the hospital. ?Summary ?Occipital neuralgia is a type of headache that causes brief episodes of very bad pain in the back of the head. ?Pain from occipital neuralgia may spread (radiate) to other parts of the head. ?Treatment for this condition includes rest, massage, and medicines. ?This information is not intended to replace advice given to you by your health care provider. Make sure you discuss any questions you have with your health care provider. ?Document Revised: 06/08/2020 Document Reviewed: 06/08/2020 ?Elsevier Patient Education ? 2022 [employer] ? ?Peripheral Neuropathy ?Peripheral neuropathy is a type of nerve damage. It affects nerves that carry signals between the spinal  cord and the arms, legs, and the rest of the body (peripheral nerves). It does not affect nerves in the spinal cord or brain. In peripheral neuropathy, one nerve or a group of nerves may be damaged. Peripheral neuropathy is a broad category that includes many specific nerve disorders, like diabetic neuropathy, hereditary neuropathy, and carpal tunnel syndrome. ?What are the causes? ?This condition may be caused by: ?Diabetes. This is the most common cause of peripheral neuropathy. ?Nerve injury. ?Pressure or stress on a nerve that lasts a long time. ?Lack (deficiency) of B vitamins. This can result from alcoholism, poor diet, or a restricted diet. ?Infections. ?Autoimmune diseases, such as rheumatoid arthritis and systemic lupus erythematosus. ?Nerve diseases that are passed from parent to child (inherited). ?Some medicines, such as cancer medicines (chemotherapy). ?Poisonous (toxic) substances, such as lead and mercury. ?Too little blood flowing to the legs. ?Kidney disease. ?Thyroid disease. ?In some cases, the cause of this condition is not known. ?What are the signs or symptoms? ?Symptoms of this condition depend on which of your nerves is damaged. Common symptoms include: ?Loss of feeling (numbness) in the feet, hands, or both. ?Tingling in the feet, hands, or both. ?Burning pain. ?Very sensitive skin. ?Weakness. ?Not being able to move a part of the  body (paralysis). ?Muscle twitching. ?Clumsiness or poor coordination. ?Loss of balance. ?Not being able to control your bladder. ?Feeling dizzy. ?Sexual problems. ?How is this diagnosed? ?Diagnosing and finding the cause of peripheral neuropathy can be difficult. Your health care provider will take your medical history and do a physical exam. A neurological exam will also be done. This involves checking things that are affected by your brain, spinal cord, and nerves (nervous system). For example, your health care provider will check your reflexes, how you move,  and what you can feel. ?You may have other tests, such as: ?Blood tests. ?Electromyogram (EMG) and nerve conduction tests. These tests check nerve function and how well the nerves are controlling the muscle

## 2021-11-16 NOTE — Progress Notes (Signed)
?[facility] ? ? ? ?Provider:  Dr Lucia Gaskins ?Requesting Provider: Rolan Bucco., P* ?Primary Care Provider:  Rolan Bucco., PA-C ? ?CC:  imbalance, multiple neck and low back surgeries ? ?Interval history November 18, 2021: Patient is here for follow-up.  She still reports imbalance, pain radiating back up from the neck to the scalp (likely occipital neuralgia). I recommend injections into the neck at Dr. Verlee Rossetti office.   Also discussed in detail records I reviewed (see below). Will check a few more labs for anything else that could cause numbness in the legs but likely etiology is B12 deficiency and the multiple surgeries on the back but will check for other causes, also long history of smoking. Feet feel freezing and feel cold to the touch, may recommend vascular studies from primary care to ensure no peripheral vascular disease due to the many years of smoking.  she also has a history of breaking her hip. Has fibromyalgia and widespread osteoporosis.  ? ?Reviewed notes, labs and imaging from outside physicians, which showed: I reviewed over 100 pages of prior records.  Following are the significant findings from the records I reviewed: ? ?Patient has chronic kidney disease stage III, depression, fatty liver, hemochromatosis with cirrhosis, generalized osteoarthritis of multiple sites, osteoporosis, peripheral neuropathy and vitamin B12 deficiency. ? ?MRI of the lumbar spine December 05, 2013: At L2-L3 there is mild annular bulge and mild facet arthrosis with very mild central canal encroachment, there is minimal neuroforaminal narrowing bilaterally.  At L3-L4 there is mild annular bulge and mild facet arthrosis without significant central canal compromise.  There is minimal left neuroforaminal encroachment.  The right neuroforamen is preserved.  At L4-L5 there is mild annular bulge with posterior compression.  There is no central canal compromise present.  There is minimal right  neuroforaminal narrowing.  At L5-S1 there is mild annular bulge and minimal facet arthrosis without significant central canal compromise or neuroforaminal narrowing postcontrast images are unremarkable with no focal abnormal enhancement identified.  Impression: Mild degenerative changes throughout the lumbar spine as described with areas of mild central canal stenosis and neuroforaminal narrowing, unremarkable postsurgical changes at L4-5 level. ? ?MRI of the lumbar spine June 11, 2015 compared to MRI of the lumbar spine July 20, 2014 showed interval progression of moderate multilevel degenerative disc and joint disease in the lumbar spine, most pronounced at the L4-L5 level where there is progressive now moderate to severe spinal canal stenosis and effacement of the right lateral recess secondary to an intraspinal synovial cyst, presumed mass effect on the descending right L5 nerve root in this location, slight interval improvement of eccentric right disc bulge at L5-S1 with ongoing abutment of the descending right S1 nerve root in the lateral recess, moderate spinal canal stenosis at L3-L4 and mild spinal canal stenosis at L2-L3 and T11-T12 secondary to degenerative disc and joint disease superimposed upon a congenitally small spinal canal these levels are unchanged. ? ?MRI of the lumbar spine October 06, 2018 impression: 1 degenerative disc disease and endplate changes, Schmorl's nodes and minimal endplate depressions, prior surgery at L3-L4, L4-L5 and L5-S1.  2 minimal degenerative changes L1-L2 that may affect the L1 nerves.  3 mild lateral recess and foraminal disease at L2-L3.  4 lateral recess narrowing and mild to moderate bilateral foraminal stenosis at L3-L4.  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  6 encroachment on the L5 roots in the anterior neuroforamen  bilaterally at L5-S1, crowding of the L5 nerve far laterally in the right, 7 no significant findings likely related to effects on the L5 nerve roots at L4-L5 and L5-S1 correlate for L5 radiculopathy. ? ?XR of the cervical spine August 10, 2016: 4 more views: Normal alignment, multilevel spondylosis with narrowing of the C4-C5, C5-C6 and C6-C7 disc spaces.  Multilevel facet arthropathy.  No fractures, dislocation or bone destruction.  Multilevel foraminal stenosis, most severe on the right at C4-C5, C5-C6 and C6-C7 and on the left at C6-C7.  No fracture or bone destruction or dislocation.  Normal prevertebral soft tissues.  Impression: Multilevel spondylosis and facet arthropathy with multilevel foraminal stenosis. ? ?MRI of the cervical spine May 31, 2018: C2-C3 focal thickening of the posterior longitudinal ligament, facet spur and joint effusion on the left causes some minimal foraminal encroachment, no central stenosis.  C3-C4: Minimal posterior disc bulge, facet hypertrophy left greater than right with foraminal spurs, shallow neural arch, borderline central stenosis, left foraminal stenosis.  C4-C5 degenerative circumferential bulging disc, slight kyphotic angulation C3 and C4, shallow neural arch, mild to moderate central canal stenosis with cord compression, right greater than left uncal spurs with moderate to severe right foraminal stenosis, possible cord signal abnormality.  C5-C6 6 degenerative circumferential bulging disc with superimposed posterior lateral protrusions right greater than left and right greater than left uncal spurs, there is moderate focal central stenosis canal measuring between 6 and 7 mm with cord compression, moderate to severe bilateral foraminal stenosis.  C6-C7: Broad posterior disc bulge and some thickening of the posterior longitudinal ligament, focal buckling of ligamentum flavum, shallow neural arch, mild central stenosis with flattening of the ventral cord and minimal  compression against the right lamina.  Moderate to severe bilateral foraminal stenosis.  C7-T1: Normal posterior disc, neuroforamen are patent, no central stenosis.  Impression: 1 reversal of cervical lordosis and degenerative mid cervical disks, uncal spurs and facet arthropathy, 2.  Left foraminal narrowing C2-C3 and C3-C4.  3 central stenosis minimal cord compression and abnormal cord signal and C4-C5 with bilateral foraminal stenosis.  4 central and bilateral foraminal stenosis with minimal cord compression at C5-C6.  5 minimal cord contact and compression and bilateral foraminal stenosis at C6-C7. ? ?Patient was admitted on November 27, 2018 to undergo elective procedure ACDF and fusion C4-C7.  Patient was admitted on January 29, 2019 to undergo elective lumbar decompression, lumbar laminectomy L4-S1, she did have a CSF leak intraoperatively that was repaired.  In both cases she had a satisfactory hospital course.  July 19, 2017 she had a right L5-S1 hemilaminectomy, discectomy and foraminotomies.  At least 3 lumbar surgeries, she also had one in 2016 for lumbar laminectomies at L2 L3-L4-L5 and S1 with bilateral foraminotomies L2-L3, L3-L4, L4-L5 and L5-S1 with decompression of bilateral L2 L3-L4-L5 and S1 nerve roots, partial discectomy right L4-L5.Marland Kitchen  There is also reference to a second neck fusion in 2000. Also record of partial tearing of the left gluteus medius tendon at its insertion 08/11/2020.  She received corticosteroid injections for this.There are records of multiple epidural steroid injections.  Please see records loaded into epic.  ? ?MRI of the lumbar spine 05/29/2019: Extensive postoperative changes, no spinal stenosis at the postoperative levels, interval resection of a right paracentral disc extrusion at L5-S1, epidural fibrosis surrounding the right S1 nerve root, borderline central stenosis at L1-L2 similar to the prior study, multilevel neuroforaminal narrowing  due to disc protrusions and  osteophytes. ? ?There are records of multiple epidural steroid injections.  Please see records loaded into epic.  ? ?HPI:  Kelly Choi is a 76 y.o. female here as requested by Rolan Buccoompton, Kimberly D., P* for cerebellar ataxia

## 2021-11-18 DIAGNOSIS — R200 Anesthesia of skin: Secondary | ICD-10-CM | POA: Insufficient documentation

## 2021-11-18 DIAGNOSIS — M48061 Spinal stenosis, lumbar region without neurogenic claudication: Secondary | ICD-10-CM | POA: Insufficient documentation

## 2021-11-18 DIAGNOSIS — G629 Polyneuropathy, unspecified: Secondary | ICD-10-CM | POA: Insufficient documentation

## 2021-11-18 DIAGNOSIS — R270 Ataxia, unspecified: Secondary | ICD-10-CM | POA: Insufficient documentation

## 2021-11-18 DIAGNOSIS — R269 Unspecified abnormalities of gait and mobility: Secondary | ICD-10-CM | POA: Insufficient documentation

## 2021-11-18 DIAGNOSIS — G959 Disease of spinal cord, unspecified: Secondary | ICD-10-CM | POA: Insufficient documentation

## 2021-11-18 DIAGNOSIS — E538 Deficiency of other specified B group vitamins: Secondary | ICD-10-CM | POA: Insufficient documentation

## 2021-11-21 LAB — TSH: TSH: 1.83 u[IU]/mL (ref 0.450–4.500)

## 2021-11-21 LAB — MULTIPLE MYELOMA PANEL, SERUM
Albumin SerPl Elph-Mcnc: 4.1 g/dL (ref 2.9–4.4)
Albumin/Glob SerPl: 1.3 (ref 0.7–1.7)
Alpha 1: 0.3 g/dL (ref 0.0–0.4)
Alpha2 Glob SerPl Elph-Mcnc: 0.9 g/dL (ref 0.4–1.0)
B-Globulin SerPl Elph-Mcnc: 1.1 g/dL (ref 0.7–1.3)
Gamma Glob SerPl Elph-Mcnc: 0.9 g/dL (ref 0.4–1.8)
Globulin, Total: 3.2 g/dL (ref 2.2–3.9)
IgA/Immunoglobulin A, Serum: 302 mg/dL (ref 64–422)
IgG (Immunoglobin G), Serum: 810 mg/dL (ref 586–1602)
IgM (Immunoglobulin M), Srm: 49 mg/dL (ref 26–217)
Total Protein: 7.3 g/dL (ref 6.0–8.5)

## 2021-11-21 LAB — HEMOGLOBIN A1C
Est. average glucose Bld gHb Est-mCnc: 103 mg/dL
Hgb A1c MFr Bld: 5.2 % (ref 4.8–5.6)

## 2021-11-21 LAB — VITAMIN B6: Vitamin B6: 41.6 ug/L (ref 3.4–65.2)

## 2021-11-21 LAB — VITAMIN B1: Thiamine: 155.9 nmol/L (ref 66.5–200.0)

## 2021-11-23 ENCOUNTER — Telehealth: Payer: Self-pay | Admitting: Neurology

## 2021-11-23 NOTE — Telephone Encounter (Signed)
Labs pending review by Dr Jaynee Eagles.  ?

## 2021-11-23 NOTE — Telephone Encounter (Signed)
Pt left vm @ 12:22 asking for a call with results from lab work done on 03-27 ?

## 2021-11-24 ENCOUNTER — Telehealth: Payer: Self-pay | Admitting: *Deleted

## 2021-11-24 NOTE — Telephone Encounter (Signed)
-----   Message from Melvenia Beam, MD sent at 11/23/2021  8:07 PM EDT ----- ?Blood work normal,  have a great week! Dr. Jaynee Eagles ? ?

## 2021-11-24 NOTE — Telephone Encounter (Signed)
I called and spoke to pt and let her know that the lab results were normal.  She verbalized understanding.  Appreciated call back.  ?

## 2021-11-24 NOTE — Telephone Encounter (Signed)
Spoke to patient gave lab results . Pt thanked me for calling  ?

## 2021-12-24 ENCOUNTER — Telehealth: Payer: Self-pay | Admitting: Neurology

## 2021-12-24 NOTE — Telephone Encounter (Signed)
Pt states she will not be in to see Dr Davy Pique until June.  Pt would like to know if there is anything Dr Jaynee Eagles would allow her to take or do to address the no balance issue(legs and feet), please call ?

## 2021-12-24 NOTE — Telephone Encounter (Signed)
Called patient.  She relayed that Dr. Davy Pique cannot see her until January 26, 2022, and she was wanting to know if there is anything Dr. Jaynee Eagles break to her before then.  I relayed  for her the last office visit note what Dr. Jaynee Eagles wanted to do.  Labs came back as normal which is a good thing.  Also she was to follow-up with her primary care doctor or the PA [facility] relating to vascular studies  and this to be referred by her primary.  She had not done this.  I refaxed the notes to [facility] and the patient is to call at some point to follow-up on that piece of it.  I relayed Dr. Jaynee Eagles out of the office will be back Tuesday. Not sure what else to offer.  She feels that there must be something to assist with her balance.  She had seen Dr. Brien Few for [place]/EMG, then she said that Dr. Ellene Route did not have anything to offer her and she was devastated.   She appreciated call back. ?
